# Patient Record
Sex: Female | Born: 1980 | Race: White | Hispanic: No | Marital: Married | Smoking: Former smoker
Health system: Southern US, Community
[De-identification: ages and names within clinical notes are randomized; demographics above are authoritative.]

---

## 2017-03-19 DIAGNOSIS — R87610 Atypical squamous cells of undetermined significance on cytologic smear of cervix (ASC-US): Secondary | ICD-10-CM | POA: Insufficient documentation

## 2017-03-19 DIAGNOSIS — F419 Anxiety disorder, unspecified: Secondary | ICD-10-CM | POA: Insufficient documentation

## 2017-03-19 DIAGNOSIS — I10 Essential (primary) hypertension: Secondary | ICD-10-CM

## 2017-03-19 HISTORY — DX: Anxiety disorder, unspecified: F41.9

## 2017-03-19 HISTORY — DX: Atypical squamous cells of undetermined significance on cytologic smear of cervix (ASC-US): R87.610

## 2017-03-19 HISTORY — DX: Essential (primary) hypertension: I10

## 2017-07-08 DIAGNOSIS — F3340 Major depressive disorder, recurrent, in remission, unspecified: Secondary | ICD-10-CM | POA: Insufficient documentation

## 2017-07-08 HISTORY — DX: Major depressive disorder, recurrent, in remission, unspecified: F33.40

## 2017-10-17 HISTORY — PX: CHOLECYSTECTOMY: SHX55

## 2018-01-29 DIAGNOSIS — M1990 Unspecified osteoarthritis, unspecified site: Secondary | ICD-10-CM

## 2018-01-29 HISTORY — DX: Unspecified osteoarthritis, unspecified site: M19.90

## 2018-02-15 HISTORY — PX: TUBAL LIGATION: SHX77

## 2018-04-16 DIAGNOSIS — R7989 Other specified abnormal findings of blood chemistry: Secondary | ICD-10-CM | POA: Insufficient documentation

## 2018-04-16 HISTORY — DX: Other specified abnormal findings of blood chemistry: R79.89

## 2018-06-30 DIAGNOSIS — E782 Mixed hyperlipidemia: Secondary | ICD-10-CM

## 2018-06-30 HISTORY — DX: Mixed hyperlipidemia: E78.2

## 2018-12-15 DIAGNOSIS — M797 Fibromyalgia: Secondary | ICD-10-CM | POA: Insufficient documentation

## 2018-12-15 HISTORY — DX: Fibromyalgia: M79.7

## 2019-03-25 DIAGNOSIS — Z8249 Family history of ischemic heart disease and other diseases of the circulatory system: Secondary | ICD-10-CM | POA: Insufficient documentation

## 2019-08-19 DIAGNOSIS — M545 Low back pain, unspecified: Secondary | ICD-10-CM | POA: Insufficient documentation

## 2019-08-19 DIAGNOSIS — M5416 Radiculopathy, lumbar region: Secondary | ICD-10-CM | POA: Insufficient documentation

## 2019-08-19 DIAGNOSIS — M5136 Other intervertebral disc degeneration, lumbar region: Secondary | ICD-10-CM | POA: Insufficient documentation

## 2019-08-19 DIAGNOSIS — M419 Scoliosis, unspecified: Secondary | ICD-10-CM | POA: Insufficient documentation

## 2019-08-19 HISTORY — DX: Low back pain, unspecified: M54.50

## 2019-10-10 DIAGNOSIS — S32402A Unspecified fracture of left acetabulum, initial encounter for closed fracture: Secondary | ICD-10-CM | POA: Insufficient documentation

## 2019-10-10 DIAGNOSIS — S3210XA Unspecified fracture of sacrum, initial encounter for closed fracture: Secondary | ICD-10-CM | POA: Insufficient documentation

## 2020-05-25 DIAGNOSIS — S62306A Unspecified fracture of fifth metacarpal bone, right hand, initial encounter for closed fracture: Secondary | ICD-10-CM | POA: Insufficient documentation

## 2020-06-22 DIAGNOSIS — Z4789 Encounter for other orthopedic aftercare: Secondary | ICD-10-CM

## 2020-06-22 HISTORY — DX: Encounter for other orthopedic aftercare: Z47.89

## 2020-07-14 HISTORY — PX: HAND SURGERY: SHX662

## 2021-12-03 ENCOUNTER — Encounter: Payer: Self-pay | Admitting: *Deleted

## 2021-12-03 ENCOUNTER — Encounter: Payer: Self-pay | Admitting: Cardiology

## 2021-12-11 ENCOUNTER — Other Ambulatory Visit: Payer: Self-pay

## 2021-12-11 ENCOUNTER — Encounter: Payer: Self-pay | Admitting: Cardiology

## 2021-12-11 ENCOUNTER — Ambulatory Visit: Payer: Managed Care, Other (non HMO) | Admitting: Cardiology

## 2021-12-11 VITALS — BP 122/78 | HR 108 | Ht 64.0 in | Wt 223.4 lb

## 2021-12-11 DIAGNOSIS — I10 Essential (primary) hypertension: Secondary | ICD-10-CM

## 2021-12-11 DIAGNOSIS — F321 Major depressive disorder, single episode, moderate: Secondary | ICD-10-CM | POA: Insufficient documentation

## 2021-12-11 DIAGNOSIS — R0789 Other chest pain: Secondary | ICD-10-CM

## 2021-12-11 DIAGNOSIS — F411 Generalized anxiety disorder: Secondary | ICD-10-CM

## 2021-12-11 DIAGNOSIS — E039 Hypothyroidism, unspecified: Secondary | ICD-10-CM | POA: Insufficient documentation

## 2021-12-11 DIAGNOSIS — G43709 Chronic migraine without aura, not intractable, without status migrainosus: Secondary | ICD-10-CM | POA: Insufficient documentation

## 2021-12-11 DIAGNOSIS — E669 Obesity, unspecified: Secondary | ICD-10-CM | POA: Insufficient documentation

## 2021-12-11 DIAGNOSIS — Z713 Dietary counseling and surveillance: Secondary | ICD-10-CM

## 2021-12-11 DIAGNOSIS — G4700 Insomnia, unspecified: Secondary | ICD-10-CM

## 2021-12-11 DIAGNOSIS — E559 Vitamin D deficiency, unspecified: Secondary | ICD-10-CM | POA: Insufficient documentation

## 2021-12-11 DIAGNOSIS — D518 Other vitamin B12 deficiency anemias: Secondary | ICD-10-CM

## 2021-12-11 DIAGNOSIS — R079 Chest pain, unspecified: Secondary | ICD-10-CM | POA: Diagnosis not present

## 2021-12-11 DIAGNOSIS — E119 Type 2 diabetes mellitus without complications: Secondary | ICD-10-CM | POA: Insufficient documentation

## 2021-12-11 HISTORY — DX: Insomnia, unspecified: G47.00

## 2021-12-11 HISTORY — DX: Other vitamin B12 deficiency anemias: D51.8

## 2021-12-11 HISTORY — DX: Major depressive disorder, single episode, moderate: F32.1

## 2021-12-11 HISTORY — DX: Dietary counseling and surveillance: Z71.3

## 2021-12-11 HISTORY — DX: Vitamin D deficiency, unspecified: E55.9

## 2021-12-11 HISTORY — DX: Generalized anxiety disorder: F41.1

## 2021-12-11 HISTORY — DX: Other chest pain: R07.89

## 2021-12-11 MED ORDER — METOPROLOL TARTRATE 100 MG PO TABS: 100.0000 mg | ORAL_TABLET | Freq: Once | ORAL | 0 refills | Status: DC

## 2021-12-11 MED ORDER — ASPIRIN EC 81 MG PO TBEC
81.0000 mg | DELAYED_RELEASE_TABLET | Freq: Every day | ORAL | 3 refills | Status: AC
Start: 2021-12-11 — End: ?

## 2021-12-11 NOTE — Progress Notes (Signed)
Cardiology Consultation:    Date:  12/11/2021   ID:  Sydney GuessJessica Fletcher, DOB [birth year], MRN [id number]  PCP:  Joaquin MusicHarless, Emily, NP  Cardiologist:  Gypsy Balsamobert Danisha Brassfield, MD   Referring MD: Joaquin MusicHarless, Emily, NP   Chief Complaint  Patient presents with   eleveated BP and HR    Ongoing for 6 weeks     History of Present Illness:    Sydney GuessJessica Fletcher is a 41 y.o. female who is being seen today for the evaluation of multiple issue including high blood pressure, palpitations, atypical chest pain.  At the request of Joaquin MusicHarless, Emily, NP.  She is a daughter of patient of mine.  She does have constellation of symptoms.  I will she check her blood pressure on the regular basis she see elevation of her blood pressure.  She been taking for medication including diuretic.  She needs criteria for multidrug-resistant hypertension.  On top of that she described heart palpitations.  Abrupt onset of breath also happen anytime not related to exercise.  Also she complain of having chest pain chest pain she described as tightness squeezing pressure burning chest discomfort anytime he wants to.  Does not associate happen with exercise lasting few minutes there is some shortness of breath associated with this sensation but no sweating.  Sometimes he can palpitation with this sensation.  She does not exercise on a regular basis.  She does have fibromyalgia, she said the request was elevated but not much.  She does not smoke, never did.  She snores and wakes up sometimes in middle of night with dry mouth.  Sometimes she wake up in the middle of the night with headache and also with palpitations.  Past Medical History:  Diagnosis Date   Anxiety 03/19/2017   Arthritis 01/29/2018   Formatting of this note might be different from the original. 2019: hands   Atypical squamous cells of undetermined significance on cytologic smear of cervix (ASC-US) 03/19/2017   Formatting of this note might be different from the original. HRHPV (+) Formatting  of this note might be different from the original. HRHPV (+)   Benign hypertension 03/19/2017   Formatting of this note might be different from the original. 2018: stage 3, atenolol Formatting of this note might be different from the original. 2018: stage 3, atenolol   Fibromyalgia 12/15/2018   Low serum progesterone 04/16/2018   Formatting of this note might be different from the original. 2019: post-mirena Formatting of this note might be different from the original. 2019: post-mirena   Mixed hyperlipidemia 06/30/2018   Formatting of this note might be different from the original. 2020   Recurrent major depressive disorder, in remission (HCC) 07/08/2017   Formatting of this note might be different from the original. 2018: 14 Formatting of this note might be different from the original. 2018: 14    Past Surgical History:  Procedure Laterality Date   CHOLECYSTECTOMY  10/2017   HAND SURGERY Right 07/14/2020   TUBAL LIGATION  02/2018    Current Medications: Current Meds  Medication Sig   ALPRAZolam (XANAX) 0.25 MG tablet Take 0.25 mg by mouth daily as needed for anxiety. For panic attacks   amLODipine (NORVASC) 5 MG tablet Take 5 mg by mouth daily.   buPROPion (WELLBUTRIN XL) 150 MG 24 hr tablet Take 150 mg by mouth daily.   cloNIDine (CATAPRES) 0.1 MG tablet Take 0.1 mg by mouth daily as needed (If dystolic over 90). For BP over 160/100   DULoxetine (CYMBALTA) 60  MG capsule Take 60 mg by mouth daily.   Galcanezumab-gnlm (EMGALITY) 120 MG/ML SOAJ Inject 120 mg into the skin every 30 (thirty) days.   hydrochlorothiazide (HYDRODIURIL) 12.5 MG tablet Take 12.5 mg by mouth daily.   Lemborexant (DAYVIGO) 5 MG TABS Take 5 mg by mouth at bedtime.   losartan (COZAAR) 50 MG tablet Take 50 mg by mouth daily.   Ubrogepant (UBRELVY) 100 MG TABS Take 100 mg by mouth as needed (migraine). May take second dose at least 2 hours after first dose as needed once daily   Vitamin D, Ergocalciferol, (DRISDOL) 1.25  MG (50000 UNIT) CAPS capsule Take 50,000 Units by mouth every 7 (seven) days.     Allergies:   Codeine   Social History   Socioeconomic History   Marital status: Married    Spouse name: Not on file   Number of children: Not on file   Years of education: Not on file   Highest education level: Not on file  Occupational History   Not on file  Tobacco Use   Smoking status: Former    Types: Cigarettes    Quit date: 12/19/2020    Years since quitting: 0.9   Smokeless tobacco: Not on file  Substance and Sexual Activity   Alcohol use: Not on file   Drug use: Not on file   Sexual activity: Not on file  Other Topics Concern   Not on file  Social History Narrative   Not on file   Social Determinants of Health   Financial Resource Strain: Not on file  Food Insecurity: Not on file  Transportation Needs: Not on file  Physical Activity: Not on file  Stress: Not on file  Social Connections: Not on file     Family History: The patient's family history includes Diabetes in her paternal grandmother; Heart attack in her father; Hypertension in her father; Migraines in her father; Rheum arthritis in her maternal grandmother. ROS:   Please see the history of present illness.    All 14 point review of systems negative except as described per history of present illness.  EKGs/Labs/Other Studies Reviewed:    The following studies were reviewed today:   EKG:  EKG is  ordered today.  The ekg ordered today demonstrates normal sinus rhythm, normal P interval, with his complex duration of which regards to symptoms  Recent Labs: No results found for requested labs within last 8760 hours.  Recent Lipid Panel No results found for: CHOL, TRIG, HDL, CHOLHDL, VLDL, LDLCALC, LDLDIRECT  Physical Exam:    VS:  BP 122/78 (BP Location: Left Arm, Patient Position: Sitting)    Pulse (!) 108    Ht 5\' 4"  (1.626 m)    Wt 223 lb 6.4 oz (101.3 kg)    SpO2 98%    BMI 38.35 kg/m     Wt Readings from Last  3 Encounters:  12/11/21 223 lb 6.4 oz (101.3 kg)  11/14/21 226 lb (102.5 kg)     GEN:  Well nourished, well developed in no acute distress HEENT: Normal NECK: No JVD; No carotid bruits LYMPHATICS: No lymphadenopathy CARDIAC: RRR, no murmurs, no rubs, no gallops RESPIRATORY:  Clear to auscultation without rales, wheezing or rhonchi  ABDOMEN: Soft, non-tender, non-distended MUSCULOSKELETAL:  No edema; No deformity  SKIN: Warm and dry NEUROLOGIC:  Alert and oriented x 3 PSYCHIATRIC:  Normal affect   ASSESSMENT:    1. Benign hypertension   2. Atypical chest pain   3. Generalized anxiety disorder  PLAN:    In order of problems listed above:  Lady have multiple symptoms for support she is meeting criteria for multidrug-resistant hypertension she is already on diuretic and 3 additional medications.  I will schedule her to have renal duplex evaluation to rule out renal related hypertension.  Secondary issue that need to be done till weight is in possibility of sleep apnea.  We will schedule him to have a sleep study. Because of high blood pressure requiring multiple medications I will ask him to have echocardiogram done to look for evidence of left ventricle hypertrophy. Palpitations I will ask her to wear Zio patch for 2 weeks to see if she got any significant arrhythmia. Chest pain even to light difficult required evaluation.  I started her on baby aspirin every single day, she will have coronary CT angio to rule out significant coronary artery disease.   Medication Adjustments/Labs and Tests Ordered: Current medicines are reviewed at length with the patient today.  Concerns regarding medicines are outlined above.  No orders of the defined types were placed in this encounter.  No orders of the defined types were placed in this encounter.   Signed, Georgeanna Lea, MD, [facility]. 12/11/2021 2:27 PM    [facility]

## 2021-12-11 NOTE — Addendum Note (Signed)
Addended by: Edwyna Shell I on: 12/11/2021 03:02 PM   Modules accepted: Orders

## 2021-12-11 NOTE — Patient Instructions (Addendum)
Medication Instructions:  Your physician has recommended you make the following change in your medication:   START: Aspirin 81 mg daily  *If you need a refill on your cardiac medications before your next appointment, please call your pharmacy*   Lab Work: BMP 1 week before CT If you have labs (blood work) drawn today and your tests are completely normal, you will receive your results only by: MyChart Message (if you have MyChart) OR A paper copy in the mail If you have any lab test that is abnormal or we need to change your treatment, we will call you to review the results.   Testing/Procedures: Your physician has requested that you have an echocardiogram. Echocardiography is a painless test that uses sound waves to create images of your heart. It provides your doctor with information about the size and shape of your heart and how well your hearts chambers and valves are working. This procedure takes approximately one hour. There are no restrictions for this procedure.  A zio monitor was ordered today. It will remain on for 14 days. You will then return monitor and event diary in provided box. It takes 1-2 weeks for report to be downloaded and returned to [place]. We will call you with the results. If monitor falls off or has orange flashing light, please call Zio for further instructions.      Your cardiac CT will be scheduled at one of the below locations:   [facility] [address] [place], [place] [address] [phone]  OR  [facility] [address] [place], [place] [address] [phone]  If scheduled at [facility], please arrive at the [facility] main entrance (entrance A) of [facility] 30 minutes prior to test start time. You can use the FREE valet parking offered at the main entrance (encouraged to control the heart rate for the test) Proceed to the [facility] Radiology Department (first  floor) to check-in and test prep.  If scheduled at [facility], please arrive 15 mins early for check-in and test prep.  Please follow these instructions carefully (unless otherwise directed):   On the Night Before the Test: Be sure to Drink plenty of water. Do not consume any caffeinated/decaffeinated beverages or chocolate 12 hours prior to your test. Do not take any antihistamines 12 hours prior to your test.  On the Day of the Test: Drink plenty of water until 1 hour prior to the test. Do not eat any food 4 hours prior to the test. You may take your regular medications prior to the test.  Take metoprolol (Lopressor) two hours prior to test. HOLD Hydrochlorothiazide morning of the test. FEMALES- please wear underwire-free bra if available, avoid dresses & tight clothing        After the Test: Drink plenty of water. After receiving IV contrast, you may experience a mild flushed feeling. This is normal. On occasion, you may experience a mild rash up to 24 hours after the test. This is not dangerous. If this occurs, you can take Benadryl 25 mg and increase your fluid intake. If you experience trouble breathing, this can be serious. If it is severe call 911 IMMEDIATELY. If it is mild, please call our office.   We will call to schedule your test 2-4 weeks out understanding that some insurance companies will need an authorization prior to the service being performed.   For non-scheduling related questions, please contact the cardiac imaging  nurse navigator should you have any questions/concerns: Rockwell Alexandria, Cardiac Imaging Nurse Navigator Larey Brick, Cardiac Imaging Nurse Navigator [facility] Direct Office Dial: [phone]   For scheduling needs, including cancellations and rescheduling, please call [place], [phone].    Follow-Up: At [facility], you and your health needs are our priority.  As part of our  continuing mission to provide you with exceptional heart care, we have created designated Provider Care Teams.  These Care Teams include your primary Cardiologist (physician) and Advanced Practice Providers (APPs -  Physician Assistants and Nurse Practitioners) who all work together to provide you with the care you need, when you need it.  We recommend signing up for the patient portal called "MyChart".  Sign up information is provided on this After Visit Summary.  MyChart is used to connect with patients for Virtual Visits (Telemedicine).  Patients are able to view lab/test results, encounter notes, upcoming appointments, etc.  Non-urgent messages can be sent to your provider as well.   To learn more about what you can do with MyChart, go to [email or web address].    Your next appointment:   2 month(s)  The format for your next appointment:   In Person  Provider:   Gypsy Balsam, MD    Other Instructions Ambulatory sleep study referral ordered

## 2021-12-12 ENCOUNTER — Ambulatory Visit (INDEPENDENT_AMBULATORY_CARE_PROVIDER_SITE_OTHER): Payer: Managed Care, Other (non HMO)

## 2021-12-12 DIAGNOSIS — R0789 Other chest pain: Secondary | ICD-10-CM | POA: Diagnosis not present

## 2021-12-12 DIAGNOSIS — R002 Palpitations: Secondary | ICD-10-CM | POA: Diagnosis not present

## 2021-12-12 DIAGNOSIS — R079 Chest pain, unspecified: Secondary | ICD-10-CM

## 2021-12-12 DIAGNOSIS — I10 Essential (primary) hypertension: Secondary | ICD-10-CM

## 2021-12-12 DIAGNOSIS — F411 Generalized anxiety disorder: Secondary | ICD-10-CM | POA: Diagnosis not present

## 2021-12-17 ENCOUNTER — Other Ambulatory Visit: Payer: Self-pay

## 2021-12-17 ENCOUNTER — Ambulatory Visit (INDEPENDENT_AMBULATORY_CARE_PROVIDER_SITE_OTHER): Payer: Managed Care, Other (non HMO)

## 2021-12-17 DIAGNOSIS — R0789 Other chest pain: Secondary | ICD-10-CM | POA: Diagnosis not present

## 2021-12-17 DIAGNOSIS — I10 Essential (primary) hypertension: Secondary | ICD-10-CM | POA: Diagnosis not present

## 2021-12-17 DIAGNOSIS — F411 Generalized anxiety disorder: Secondary | ICD-10-CM

## 2021-12-17 DIAGNOSIS — R079 Chest pain, unspecified: Secondary | ICD-10-CM

## 2021-12-17 LAB — ECHOCARDIOGRAM COMPLETE
Area-P 1/2: 4.96 cm2
S' Lateral: 2.8 cm

## 2021-12-18 LAB — BASIC METABOLIC PANEL
BUN/Creatinine Ratio: 8 — ABNORMAL LOW (ref 9–23)
BUN: 7 mg/dL (ref 6–24)
CO2: 24 mmol/L (ref 20–29)
Calcium: 9.6 mg/dL (ref 8.7–10.2)
Chloride: 101 mmol/L (ref 96–106)
Creatinine, Ser: 0.9 mg/dL (ref 0.57–1.00)
Glucose: 84 mg/dL (ref 70–99)
Potassium: 3.7 mmol/L (ref 3.5–5.2)
Sodium: 142 mmol/L (ref 134–144)
eGFR: 83 mL/min/{1.73_m2} (ref >59)

## 2021-12-20 ENCOUNTER — Telehealth (HOSPITAL_COMMUNITY): Payer: Self-pay | Admitting: *Deleted

## 2021-12-20 NOTE — Telephone Encounter (Signed)
Reaching out to patient to offer assistance regarding upcoming cardiac imaging study; pt verbalizes understanding of appt date/time, parking situation and where to check in, pre-test NPO status and medications ordered, and verified current allergies; name and call back number provided for further questions should they arise ° °Aryaa Bunting RN Navigator Cardiac Imaging °[facility] °[phone] office °[phone] cell ° °Patient to take 100mg metoprolol tartrate two hours prior to her cardiac CT scan. She is aware to arrive at 2:45pm for her 3:15pm scan. °

## 2021-12-23 ENCOUNTER — Other Ambulatory Visit: Payer: Self-pay

## 2021-12-23 ENCOUNTER — Encounter (HOSPITAL_COMMUNITY): Payer: Self-pay

## 2021-12-23 ENCOUNTER — Ambulatory Visit (HOSPITAL_COMMUNITY)
Admission: RE | Admit: 2021-12-23 | Discharge: 2021-12-23 | Disposition: A | Discharge status: Home / Self Care | Payer: Managed Care, Other (non HMO) | Source: Ambulatory Visit | Attending: Cardiology | Admitting: Cardiology

## 2021-12-23 DIAGNOSIS — R079 Chest pain, unspecified: Secondary | ICD-10-CM | POA: Insufficient documentation

## 2021-12-23 MED ORDER — NITROGLYCERIN 0.4 MG SL SUBL
0.8000 mg | SUBLINGUAL_TABLET | Freq: Once | SUBLINGUAL | Status: AC
  Administered 2021-12-23: 0.8 mg via SUBLINGUAL

## 2021-12-23 MED ORDER — IOHEXOL 350 MG/ML SOLN
95.0000 mL | Freq: Once | INTRAVENOUS | Status: AC | PRN
  Administered 2021-12-23: 95 mL via INTRAVENOUS

## 2021-12-23 MED ORDER — NITROGLYCERIN 0.4 MG SL SUBL
SUBLINGUAL_TABLET | SUBLINGUAL | Status: AC
  Filled 2021-12-23: qty 2

## 2022-01-02 ENCOUNTER — Ambulatory Visit: Payer: Managed Care, Other (non HMO) | Admitting: Cardiology

## 2022-01-14 ENCOUNTER — Encounter: Payer: Self-pay | Admitting: Cardiology

## 2022-01-14 ENCOUNTER — Other Ambulatory Visit: Payer: Self-pay

## 2022-01-14 MED ORDER — METOPROLOL SUCCINATE ER 100 MG PO TB24: 100.0000 mg | ORAL_TABLET | Freq: Every day | ORAL | 3 refills | Status: AC

## 2022-01-22 ENCOUNTER — Telehealth: Payer: Self-pay | Admitting: Cardiology

## 2022-01-22 DIAGNOSIS — G4700 Insomnia, unspecified: Secondary | ICD-10-CM

## 2022-01-22 DIAGNOSIS — E669 Obesity, unspecified: Secondary | ICD-10-CM

## 2022-01-22 NOTE — Telephone Encounter (Signed)
Order placed for sleep study. Patient informed and has no further questions at this time. ?

## 2022-01-22 NOTE — Telephone Encounter (Signed)
Patient needs an order for a sleep study, not a referral. Per [facility] they do not do anything with referrals. ? ?Our office will need to put in an order for the patient to have it done. Please call patient once test is ready to schedule  ?

## 2022-01-31 ENCOUNTER — Telehealth: Payer: Self-pay | Admitting: *Deleted

## 2022-01-31 ENCOUNTER — Other Ambulatory Visit: Payer: Self-pay | Admitting: Cardiology

## 2022-01-31 DIAGNOSIS — G4709 Other insomnia: Secondary | ICD-10-CM

## 2022-01-31 DIAGNOSIS — R0683 Snoring: Secondary | ICD-10-CM

## 2022-01-31 DIAGNOSIS — I10 Essential (primary) hypertension: Secondary | ICD-10-CM

## 2022-01-31 NOTE — Telephone Encounter (Signed)
Patient notified her insurance denied an in lab study due to no co morbidities. Recommended HST. HST scheduled for 03/11/22 @ 1:30 pm. Appointment details were given to the patient. ?

## 2022-02-04 ENCOUNTER — Telehealth: Payer: Self-pay | Admitting: Cardiology

## 2022-02-04 NOTE — Telephone Encounter (Signed)
Pt c/o BP issue: STAT if pt c/o blurred vision, one-sided weakness or slurred speech ? ?1. What are your last 5 BP readings? 110/61, 93/69, 92/60 ? ?2. Are you having any other symptoms (ex. Dizziness, headache, blurred vision, passed out)? extremely weak,  can hardly do anything,lightheaded ? ?3. What is your BP issue? Low blood pressure ? ?

## 2022-02-06 NOTE — Telephone Encounter (Signed)
Called patient and informed her of Dr. Vanetta Shawl recommendation to stop taking her hydrochlorothiazide. Patient was agreeable with this plan of care and had no further questions at this time. ?

## 2022-02-12 ENCOUNTER — Encounter: Payer: Self-pay | Admitting: Cardiology

## 2022-02-12 ENCOUNTER — Ambulatory Visit: Payer: Managed Care, Other (non HMO) | Admitting: Cardiology

## 2022-02-12 VITALS — BP 100/70 | HR 70 | Ht 64.0 in | Wt 229.2 lb

## 2022-02-12 DIAGNOSIS — G4709 Other insomnia: Secondary | ICD-10-CM

## 2022-02-12 DIAGNOSIS — Z8249 Family history of ischemic heart disease and other diseases of the circulatory system: Secondary | ICD-10-CM | POA: Diagnosis not present

## 2022-02-12 DIAGNOSIS — R0789 Other chest pain: Secondary | ICD-10-CM | POA: Diagnosis not present

## 2022-02-12 DIAGNOSIS — F419 Anxiety disorder, unspecified: Secondary | ICD-10-CM

## 2022-02-12 DIAGNOSIS — I10 Essential (primary) hypertension: Secondary | ICD-10-CM | POA: Diagnosis not present

## 2022-02-12 NOTE — Progress Notes (Signed)
?Cardiology Office Note:   ? ?Date:  02/12/2022  ? ?ID:  Sydney Fletcher, DOB 08-Mar-1981, MRN [id number] ? ?PCP:  Joaquin Music, NP  ?Cardiologist:  Gypsy Balsam, MD   ? ?Referring MD: Joaquin Music, NP  ? ?Chief Complaint  ?Patient presents with  ? Low BP  ?  Ongoing for a week   ? ? ?History of Present Illness:   ? ?Sydney Fletcher is a 41 y.o. female with past medical history significant for essential hypertension, depression, anxiety, she was referred to [place] because of constellation of symptoms and quite extensive evaluation has been performed for so far she did have coronary CT angio that was done because of atypical chest pain that came showing normal coronaries without significant obstruction, she was complaining of having some palpitations Zio patch has been placed and she was identified to have some supraventricular tachycardia, her metoprolol was changed from tartrate to succinate that seems to be controlling palpitations quite nicely, she also had echocardiogram done which showed preserved left ventricle ejection fraction, she does have left ventricle hypertrophy however.  She is awaiting sleep study which I think would be extremely beneficial to her.  Previously she had high blood pressure and there was difficulty managing this but now interestingly her blood pressure seems to be low she discontinue her hydrochlorothiazide in spite of that her blood pressure is still low I will ask her to discontinue her amlodipine.  That make it very happy because she is been complaining findings swelling of lower extremities.  Overall she is excited about sleep study hopefully she said somebody will be able to find out what the problem with her sleeping pattern is ? ?Past Medical History:  ?Diagnosis Date  ? Anxiety 03/19/2017  ? Arthritis 01/29/2018  ? Formatting of this note might be different from the original. 2019: hands  ? Atypical squamous cells of undetermined significance on cytologic smear of cervix (ASC-US)  03/19/2017  ? Formatting of this note might be different from the original. HRHPV (+) Formatting of this note might be different from the original. HRHPV (+)  ? Benign hypertension 03/19/2017  ? Formatting of this note might be different from the original. 2018: stage 3, atenolol Formatting of this note might be different from the original. 2018: stage 3, atenolol  ? Fibromyalgia 12/15/2018  ? Low serum progesterone 04/16/2018  ? Formatting of this note might be different from the original. 2019: post-mirena Formatting of this note might be different from the original. 2019: post-mirena  ? Mixed hyperlipidemia 06/30/2018  ? Formatting of this note might be different from the original. 2020  ? Recurrent major depressive disorder, in remission (HCC) 07/08/2017  ? Formatting of this note might be different from the original. 2018: 14 Formatting of this note might be different from the original. 2018: 14  ? ? ?Past Surgical History:  ?Procedure Laterality Date  ? CHOLECYSTECTOMY  10/2017  ? HAND SURGERY Right 07/14/2020  ? TUBAL LIGATION  02/2018  ? ? ?Current Medications: ?Current Meds  ?Medication Sig  ? ALPRAZolam (XANAX) 0.25 MG tablet Take 0.25 mg by mouth daily as needed for anxiety. For panic attacks  ? amLODipine (NORVASC) 5 MG tablet Take 5 mg by mouth daily.  ? aspirin EC 81 MG tablet Take 1 tablet (81 mg total) by mouth daily. Swallow whole.  ? buPROPion (WELLBUTRIN XL) 150 MG 24 hr tablet Take 150 mg by mouth daily.  ? cloNIDine (CATAPRES) 0.1 MG tablet Take 0.1 mg by mouth daily as  needed (If dystolic over 90). For BP over 160/100  ? DULoxetine (CYMBALTA) 60 MG capsule Take 60 mg by mouth daily.  ? Galcanezumab-gnlm (EMGALITY) 120 MG/ML SOAJ Inject 120 mg into the skin every 30 (thirty) days.  ? Lemborexant (DAYVIGO) 5 MG TABS Take 5 mg by mouth at bedtime.  ? losartan (COZAAR) 50 MG tablet Take 50 mg by mouth daily.  ? metoprolol succinate (TOPROL-XL) 100 MG 24 hr tablet Take 1 tablet (100 mg total) by mouth  daily. Take with or immediately following a meal.  ? Ubrogepant (UBRELVY) 100 MG TABS Take 100 mg by mouth as needed (migraine). May take second dose at least 2 hours after first dose as needed once daily  ? Vitamin D, Ergocalciferol, (DRISDOL) 1.25 MG (50000 UNIT) CAPS capsule Take 50,000 Units by mouth every 7 (seven) days.  ?  ? ?Allergies:   Codeine  ? ?Social History  ? ?Socioeconomic History  ? Marital status: Married  ?  Spouse name: Not on file  ? Number of children: Not on file  ? Years of education: Not on file  ? Highest education level: Not on file  ?Occupational History  ? Not on file  ?Tobacco Use  ? Smoking status: Former  ?  Types: Cigarettes  ?  Quit date: 12/19/2020  ?  Years since quitting: 1.1  ? Smokeless tobacco: Never  ?Substance and Sexual Activity  ? Alcohol use: Not Currently  ? Drug use: Never  ? Sexual activity: Yes  ?Other Topics Concern  ? Not on file  ?Social History Narrative  ? Not on file  ? ?Social Determinants of Health  ? ?Financial Resource Strain: Not on file  ?Food Insecurity: Not on file  ?Transportation Needs: Not on file  ?Physical Activity: Not on file  ?Stress: Not on file  ?Social Connections: Not on file  ?  ? ?Family History: ?The patient's family history includes Diabetes in her paternal grandmother; Heart attack in her father; Hypertension in her father; Migraines in her father; Rheum arthritis in her maternal grandmother. ?ROS:   ?Please see the history of present illness.    ?All 14 point review of systems negative except as described per history of present illness ? ?EKGs/Labs/Other Studies Reviewed:   ? ? ? ?Recent Labs: ?12/17/2021: BUN 7; Creatinine, Ser 0.90; Potassium 3.7; Sodium 142  ?Recent Lipid Panel ?No results found for: CHOL, TRIG, HDL, CHOLHDL, VLDL, LDLCALC, LDLDIRECT ? ?Physical Exam:   ? ?VS:  BP 100/70 (BP Location: Left Arm, Patient Position: Sitting)   Pulse 70   Ht 5\' 4"  (1.626 m)   Wt 229 lb 3.2 oz (104 kg)   SpO2 98%   BMI 39.34 kg/m?     ? ?Wt Readings from Last 3 Encounters:  ?02/12/22 229 lb 3.2 oz (104 kg)  ?12/11/21 223 lb 6.4 oz (101.3 kg)  ?11/14/21 226 lb (102.5 kg)  ?  ? ?GEN:  Well nourished, well developed in no acute distress ?HEENT: Normal ?NECK: No JVD; No carotid bruits ?LYMPHATICS: No lymphadenopathy ?CARDIAC: RRR, no murmurs, no rubs, no gallops ?RESPIRATORY:  Clear to auscultation without rales, wheezing or rhonchi  ?ABDOMEN: Soft, non-tender, non-distended ?MUSCULOSKELETAL:  No edema; No deformity  ?SKIN: Warm and dry ?LOWER EXTREMITIES: no swelling ?NEUROLOGIC:  Alert and oriented x 3 ?PSYCHIATRIC:  Normal affect  ? ?ASSESSMENT:   ? ?1. Benign hypertension   ?2. Atypical chest pain   ?3. Other insomnia   ?4. Family history of premature coronary artery disease   ?  5. Anxiety   ? ?PLAN:   ? ?In order of problems listed above: ? ?Atypical chest pain coronary CT angio showed normal coronaries. ?Benign essential hypertension, blood pressure seems to be low right now we will discontinue amlodipine prescription sent results of her blood pressure within next few weeks. ?Insomnia sleep study scheduled she is awaiting to have it done ?Family history of premature coronary artery disease the key is risk factors modifications.  I will offer her to have fasting lipid profile done since I do not see any recent results ? ? ?Medication Adjustments/Labs and Tests Ordered: ?Current medicines are reviewed at length with the patient today.  Concerns regarding medicines are outlined above.  ?No orders of the defined types were placed in this encounter. ? ?Medication changes: No orders of the defined types were placed in this encounter. ? ? ?Signed, ?Georgeanna Lea, MD, [facility] ?02/12/2022 4:06 PM    ?[facility] ?

## 2022-02-12 NOTE — Patient Instructions (Signed)
Medication Instructions:  °Your physician recommends that you continue on your current medications as directed. Please refer to the Current Medication list given to you today. ° °*If you need a refill on your cardiac medications before your next appointment, please call your pharmacy* ° ° °Lab Work: °Your physician recommends that you return for lab work in: Labs: Lipids °If you have labs (blood work) drawn today and your tests are completely normal, you will receive your results only by: °MyChart Message (if you have MyChart) OR °A paper copy in the mail °If you have any lab test that is abnormal or we need to change your treatment, we will call you to review the results. ° ° °Testing/Procedures: °None ° ° °Follow-Up: °At [facility], you and your health needs are our priority.  As part of our continuing mission to provide you with exceptional heart care, we have created designated Provider Care Teams.  These Care Teams include your primary Cardiologist (physician) and Advanced Practice Providers (APPs -  Physician Assistants and Nurse Practitioners) who all work together to provide you with the care you need, when you need it. ° °We recommend signing up for the patient portal called "MyChart".  Sign up information is provided on this After Visit Summary.  MyChart is used to connect with patients for Virtual Visits (Telemedicine).  Patients are able to view lab/test results, encounter notes, upcoming appointments, etc.  Non-urgent messages can be sent to your provider as well.   °To learn more about what you can do with MyChart, go to [email or web address].   ° °Your next appointment:   °6 month(s) ° °The format for your next appointment:   °In Person ° °Provider:   °Robert Krasowski, MD  ° ° °Other Instructions °None ° °

## 2022-03-11 ENCOUNTER — Ambulatory Visit (HOSPITAL_BASED_OUTPATIENT_CLINIC_OR_DEPARTMENT_OTHER): Payer: Managed Care, Other (non HMO) | Attending: Cardiology | Admitting: Cardiovascular Disease

## 2022-03-11 VITALS — Ht 64.0 in | Wt 228.0 lb

## 2022-03-11 DIAGNOSIS — G4719 Other hypersomnia: Secondary | ICD-10-CM

## 2022-03-11 DIAGNOSIS — I10 Essential (primary) hypertension: Secondary | ICD-10-CM | POA: Diagnosis not present

## 2022-03-11 DIAGNOSIS — G4709 Other insomnia: Secondary | ICD-10-CM | POA: Insufficient documentation

## 2022-03-11 DIAGNOSIS — G4733 Obstructive sleep apnea (adult) (pediatric): Secondary | ICD-10-CM | POA: Diagnosis not present

## 2022-03-11 DIAGNOSIS — R0683 Snoring: Secondary | ICD-10-CM | POA: Insufficient documentation

## 2022-03-18 ENCOUNTER — Encounter (HOSPITAL_BASED_OUTPATIENT_CLINIC_OR_DEPARTMENT_OTHER): Payer: Self-pay | Admitting: Cardiovascular Disease

## 2022-03-18 NOTE — Procedures (Signed)
? ? ? ?  Patient Name: Sydney Fletcher, Sydney Fletcher ?Study Date: 03/11/2022 ?Gender: Female ?D.O.B: 1980/12/11 ?Age (years): 40 ?Referring Provider: Georgeanna Lea ?Height (inches): 64 ?Interpreting Physician: Nicki Guadalajara MD, ABSM ?Weight (lbs): 228 ?RPSGT: Virden Sink ?BMI: 39 ?MRN: [id number] ?Neck Size: 14.00 ? ?CLINICAL INFORMATION ?Sleep Study Type: HST ? ?Indication for sleep study: Snoring, excessive daytime sleepiness ? ?Epworth Sleepiness Score: 17 ? ?SLEEP STUDY TECHNIQUE ?A multi-channel overnight portable sleep study was performed. The channels recorded were: nasal airflow, thoracic respiratory movement, and oxygen saturation with a pulse oximetry. Snoring was also monitored. ? ?MEDICATIONS ?ALPRAZolam (XANAX) 0.25 MG tablet ?amLODipine (NORVASC) 5 MG tablet ?aspirin EC 81 MG tablet ?buPROPion (WELLBUTRIN XL) 150 MG 24 hr tablet ?cloNIDine (CATAPRES) 0.1 MG tablet ?DULoxetine (CYMBALTA) 60 MG capsule ?Galcanezumab-gnlm (EMGALITY) 120 MG/ML SOAJ ?Lemborexant (DAYVIGO) 5 MG TABS ?losartan (COZAAR) 50 MG tablet ?metoprolol succinate (TOPROL-XL) 100 MG 24 hr tablet ?Ubrogepant (UBRELVY) 100 MG TABS ?Vitamin D, Ergocalciferol, (DRISDOL) 1.25 MG (50000 UNIT) CAPS capsule ?Patient self administered medications include: N/A. ? ?SLEEP ARCHITECTURE ?Patient was studied for 381.1 minutes. The sleep efficiency was 100.0 % and the patient was supine for 47.1%. The arousal index was 0.0 per hour. ? ?RESPIRATORY PARAMETERS ?The overall AHI was 6.1 per hour, with a central apnea index of 0 per hour. ? ?The oxygen nadir was 91% during sleep. ? ?CARDIAC DATA ?Mean heart rate during sleep was 69.3 bpm. Heart rate range was 56 - 92 bpm. ? ?IMPRESSIONS ?- Mild obstructive sleep apnea occurred during this study (AHI 6.1/h); events wore worse with supine sleep (AHI 10.4/h) versus non-supine sleep (AHI 2.6/h). The severity during REM sleep cannot be assessed on this study. ?- The patient did not have significant oxygen  desaturation with O2 nadir 91%. ?- Patient snored for 103.2 minutes (27.1%)  during the sleep. ? ?DIAGNOSIS ?- Obstructive Sleep Apnea (G47.33) ? ?RECOMMENDATIONS ?- Therapeutic CPAP titration to determine optimal pressure required to alleviate sleep disordered breathing. Consider a trial of Auto-PAP with EPR of 3 at 6 - 16 cm of water.  ?- Effort should be made to optimize nasal and oropharyngeal patency. ?- Positional therapy avoiding supine position during sleep. ?- If patient is against CPAP initiation a customized oral appliance may be considered. ?- If patient continues to have excessive daytime sleepiness on therapy consider a further evaluation to assess for narcolepsy or idiopathic hypersomnia. ?- Avoid alcohol, sedatives and other CNS depressants that may worsen sleep apnea and disrupt normal sleep architecture. ?- Sleep hygiene should be reviewed to assess factors that may improve sleep quality. ?- Weight management (BMI 39) and regular exercise should be initiated or continued. ?- Recommend a download and sleep clinic evaluation one month after initiation of therapy ? ? ?[Electronically signed] 03/18/2022 07:31 PM ? ?Nicki Guadalajara MD, [facility], ABSM ?Diplomate, Biomedical engineer of Sleep Medicine ? ?NPI: [id number] ? ?[facility] ?PH: [phone]   FX: [phone] ?ACCREDITED BY THE AMERICAN ACADEMY OF SLEEP MEDICINE ? ?

## 2022-03-19 ENCOUNTER — Telehealth: Payer: Self-pay | Admitting: *Deleted

## 2022-03-19 NOTE — Telephone Encounter (Signed)
-----   Message from Lennette Bihari, MD sent at 03/18/2022  7:37 PM EDT ----- ?Sydney Fletcher, please notify pt and set up for Auto-PAP; if patient against can consider oral appliance. ?

## 2022-03-19 NOTE — Telephone Encounter (Signed)
Patient notified of HST results and recommendations. She would like to do CPAP therapy. Patient lives in [place]. CPAP order will be sent to [facility] in [place]. ?

## 2022-05-19 DIAGNOSIS — R569 Unspecified convulsions: Secondary | ICD-10-CM | POA: Insufficient documentation

## 2022-05-19 HISTORY — DX: Unspecified convulsions: R56.9

## 2022-06-02 DIAGNOSIS — G379 Demyelinating disease of central nervous system, unspecified: Secondary | ICD-10-CM | POA: Insufficient documentation

## 2022-07-09 ENCOUNTER — Ambulatory Visit (INDEPENDENT_AMBULATORY_CARE_PROVIDER_SITE_OTHER): Payer: BC Managed Care – PPO | Admitting: Cardiovascular Disease

## 2022-07-09 ENCOUNTER — Encounter: Payer: Self-pay | Admitting: Cardiovascular Disease

## 2022-07-09 VITALS — BP 122/74 | HR 67 | Ht 64.0 in | Wt 244.0 lb

## 2022-07-09 DIAGNOSIS — G4733 Obstructive sleep apnea (adult) (pediatric): Secondary | ICD-10-CM | POA: Diagnosis not present

## 2022-07-09 DIAGNOSIS — I10 Essential (primary) hypertension: Secondary | ICD-10-CM

## 2022-07-09 DIAGNOSIS — R0789 Other chest pain: Secondary | ICD-10-CM

## 2022-07-09 DIAGNOSIS — Z9989 Dependence on other enabling machines and devices: Secondary | ICD-10-CM | POA: Diagnosis not present

## 2022-07-09 DIAGNOSIS — I471 Supraventricular tachycardia: Secondary | ICD-10-CM

## 2022-07-09 DIAGNOSIS — G4719 Other hypersomnia: Secondary | ICD-10-CM

## 2022-07-09 DIAGNOSIS — R0683 Snoring: Secondary | ICD-10-CM

## 2022-07-09 NOTE — Progress Notes (Signed)
Cardiology Office Note    Date:  07/12/2022   ID:  Sydney Fletcher, DOB 03/15/1981, MRN [id number]  PCP:  Joaquin Music, NP  Cardiologist:  Nicki Guadalajara, MD (sleep); Dr. Bing Matter  New sleep consultation referred by Dr. Bing Matter   History of Present Illness:  Sydney Fletcher is a 42 y.o. female who is followed by Dr. Bing Matter for cardiology care.  She has a history of essential hypertension, depression, anxiety, and atypical chest pain.  She was found to have normal coronary arteries on coronary CT angiography.  She has had issues with palpitations and was found to have several episodes of SVT on Zio patch monitoring for which she is now on metoprolol succinate.  She has a history of hypertension with some blood pressure lability.  She has had issues with shaking episodes.  She is being evaluated by a neurologist in [facility] and apparently had an MRI and is to have a spinal tap to evaluate for potential MS.  She has been documented to have preserved global LV contractility with left ventricular hypertrophy.  Due to concerns for obstructive sleep apnea she was referred by Dr. Bing Matter for a home sleep study which was done on March 11, 2022.  At that time she admitted to snoring, nonrestorative sleep, nocturia, and significant daytime sleepiness with an Epworth Sleepiness Scale score calculating at 17.  On her home study, she was found to have overall mild sleep apnea with an AHI of 6.1 with events worse with supine sleep with an AHI of 10.4.  She snored for 103.2 minutes of her sleep evaluation.  It was recommended that she consider a trial of AutoPap therapy with EPR of 3 at a 6 to 16 cm pressure range and also potential alternatives such as a customized oral appliance could be considered.  Her CPAP set up date was April 22, 2022.  Initially, she was sleeping in a recliner.  She is compliant on her most recent download from July 24 through August 22 with usage days at 97% and usage greater than 4  hours at 83%.  She recently has been averaging 5 hours and 8 minutes of CPAP use.  She typically goes to bed between 11 PM and midnight and wakes up at 7 AM.  At her pressure range of 6 to 16 cm of water, AHI is 1.4/h.  Her 95th percentile pressure is 12.0 with maximum average pressure of 13.1.  Since initiating CPAP therapy, she feels significantly better.  She is less sleepy.  Her sleep is restorative.  Previous significant daytime sleepiness has improved.  She presents for her initial sleep consultation and evaluation.   Past Medical History:  Diagnosis Date   Anxiety 03/19/2017   Arthritis 01/29/2018   Formatting of this note might be different from the original. 2019: hands   Atypical squamous cells of undetermined significance on cytologic smear of cervix (ASC-US) 03/19/2017   Formatting of this note might be different from the original. HRHPV (+) Formatting of this note might be different from the original. HRHPV (+)   Benign hypertension 03/19/2017   Formatting of this note might be different from the original. 2018: stage 3, atenolol Formatting of this note might be different from the original. 2018: stage 3, atenolol   Fibromyalgia 12/15/2018   Low serum progesterone 04/16/2018   Formatting of this note might be different from the original. 2019: post-mirena Formatting of this note might be different from the original. 2019: post-mirena   Mixed hyperlipidemia 06/30/2018  Formatting of this note might be different from the original. 2020   Recurrent major depressive disorder, in remission (HCC) 07/08/2017   Formatting of this note might be different from the original. 2018: 14 Formatting of this note might be different from the original. 2018: 14    Past Surgical History:  Procedure Laterality Date   CHOLECYSTECTOMY  10/2017   HAND SURGERY Right 07/14/2020   TUBAL LIGATION  02/2018    Current Medications: Outpatient Medications Prior to Visit  Medication Sig Dispense Refill   ALPRAZolam  (XANAX) 0.25 MG tablet Take 0.25 mg by mouth daily as needed for anxiety. For panic attacks     amLODipine (NORVASC) 5 MG tablet Take 5 mg by mouth daily.     aspirin EC 81 MG tablet Take 1 tablet (81 mg total) by mouth daily. Swallow whole. 90 tablet 3   buPROPion (WELLBUTRIN XL) 150 MG 24 hr tablet Take 150 mg by mouth daily.     cloNIDine (CATAPRES) 0.1 MG tablet Take 0.1 mg by mouth daily as needed (If dystolic over 90). For BP over 160/100     DULoxetine (CYMBALTA) 60 MG capsule Take 60 mg by mouth daily.     losartan (COZAAR) 50 MG tablet Take 50 mg by mouth daily.     metoprolol succinate (TOPROL-XL) 100 MG 24 hr tablet Take 1 tablet (100 mg total) by mouth daily. Take with or immediately following a meal. 90 tablet 3   Ubrogepant (UBRELVY) 100 MG TABS Take 100 mg by mouth as needed (migraine). May take second dose at least 2 hours after first dose as needed once daily     Vitamin D, Ergocalciferol, (DRISDOL) 1.25 MG (50000 UNIT) CAPS capsule Take 50,000 Units by mouth every 7 (seven) days.     Galcanezumab-gnlm (EMGALITY) 120 MG/ML SOAJ Inject 120 mg into the skin every 30 (thirty) days.     Lemborexant (DAYVIGO) 5 MG TABS Take 5 mg by mouth at bedtime.     No facility-administered medications prior to visit.     Allergies:   Codeine   Social History   Socioeconomic History   Marital status: Married    Spouse name: Not on file   Number of children: Not on file   Years of education: Not on file   Highest education level: Not on file  Occupational History   Not on file  Tobacco Use   Smoking status: Former    Types: Cigarettes    Quit date: 12/19/2020    Years since quitting: 1.5   Smokeless tobacco: Never  Substance and Sexual Activity   Alcohol use: Not Currently   Drug use: Never   Sexual activity: Yes  Other Topics Concern   Not on file  Social History Narrative   Not on file   Social Determinants of Health   Financial Resource Strain: Not on file  Food  Insecurity: Not on file  Transportation Needs: Not on file  Physical Activity: Not on file  Stress: Not on file  Social Connections: Not on file    Socially she was born in [place].  She is in her second marriage.  She has 2 children.  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  She does not significantly exercise.  Family History:  The patient's family history includes Diabetes in her paternal grandmother; Heart attack in her father; Hypertension in her father; Migraines in her father; Rheum arthritis in her maternal grandmother.   Her father is 83 and has heart  problems.  He also has sleep apnea for at least 5 years documented.  Her mother is 51.  She has 1 brother age 60.  ROS General: Negative; No fevers, chills, or night sweats;  HEENT: Negative; No changes in vision or hearing, sinus congestion, difficulty swallowing Pulmonary: Negative; No cough, wheezing, shortness of breath, hemoptysis Cardiovascular: Positive for palpitations with SVT noted on ZIO monitoring, hypertension, GI: Negative; No nausea, vomiting, diarrhea, or abdominal pain GU: Negative; No dysuria, hematuria, or difficulty voiding Musculoskeletal: Negative; no myalgias, joint pain, or weakness Hematologic/Oncology: Negative; no easy bruising, bleeding Endocrine: Negative; no heat/cold intolerance; no diabetes Neuro: Negative; no changes in balance, headaches Skin: Negative; No rashes or skin lesions Psychiatric: Anxiety/depression Sleep: See HPI Other comprehensive 14 point system review is negative.   PHYSICAL EXAM:   VS:  BP 122/74   Pulse 67   Ht 5\' 4"  (1.626 m)   Wt 244 lb (110.7 kg)   SpO2 98%   BMI 41.88 kg/m     Repeat blood pressure by me was 118/70  Wt Readings from Last 3 Encounters:  07/09/22 244 lb (110.7 kg)  03/11/22 228 lb (103.4 kg)  02/12/22 229 lb 3.2 oz (104 kg)    General: Alert, oriented, no distress.  Morbid obesity Skin: normal turgor, no rashes, warm and dry HEENT:  Normocephalic, atraumatic. Pupils equal round and reactive to light; sclera anicteric; extraocular muscles intact;  Nose without nasal septal hypertrophy Mouth/Parynx benign; Mallinpatti scale 3/4 Neck: No JVD, no carotid bruits; normal carotid upstroke Lungs: clear to ausculatation and percussion; no wheezing or rales Chest wall: without tenderness to palpitation Heart: PMI not displaced, RRR, s1 s2 normal, 1/6 systolic murmur, no diastolic murmur, no rubs, gallops, thrills, or heaves Abdomen: soft, nontender; no hepatosplenomehaly, BS+; abdominal aorta nontender and not dilated by palpation. Back: no CVA tenderness Pulses 2+ Musculoskeletal: full range of motion, normal strength, no joint deformities Extremities: no clubbing cyanosis or edema, Homan's sign negative  Neurologic: grossly nonfocal; Cranial nerves grossly wnl Psychologic: Normal mood and affect   Studies/Labs Reviewed:   July 09, 2022 ECG (independently read by me): NSR at 67, no ectopy, normal intervals  Recent Labs:    Latest Ref Rng & Units 12/17/2021    1:31 PM  BMP  Glucose 70 - 99 mg/dL 84   BUN 6 - 24 mg/dL 7   Creatinine 12/19/2021 - 4.40 mg/dL 3.47   BUN/Creat Ratio 9 - 23 8   Sodium 134 - 144 mmol/L 142   Potassium 3.5 - 5.2 mmol/L 3.7   Chloride 96 - 106 mmol/L 101   CO2 20 - 29 mmol/L 24   Calcium 8.7 - 10.2 mg/dL 9.6          No data to display              No data to display         No results found for: "MCV" No results found for: "TSH" No results found for: "HGBA1C"   BNP No results found for: "BNP"  ProBNP No results found for: "PROBNP"   Lipid Panel  No results found for: "CHOL", "TRIG", "HDL", "CHOLHDL", "VLDL", "LDLCALC", "LDLDIRECT", "LABVLDL"   RADIOLOGY: No results found.   Additional studies/ records that were reviewed today include:     Patient Name: Sydney Fletcher, Sydney Fletcher Date: 03/11/2022 Gender: Female D.O.B: June 05, 1981 Age (years): 40 Referring Provider:  06/18/1981 Height (inches): 64 Interpreting Physician: Georgeanna Lea MD, ABSM Weight (lbs): 228 RPSGT:  [place], [place] BMI: 39 MRN: [id number] Neck Size: 14.00   CLINICAL INFORMATION Sleep Study Type: HST   Indication for sleep study: Snoring, excessive daytime sleepiness   Epworth Sleepiness Score: 17   SLEEP STUDY TECHNIQUE A multi-channel overnight portable sleep study was performed. The channels recorded were: nasal airflow, thoracic respiratory movement, and oxygen saturation with a pulse oximetry. Snoring was also monitored.   MEDICATIONS ALPRAZolam (XANAX) 0.25 MG tablet amLODipine (NORVASC) 5 MG tablet aspirin EC 81 MG tablet buPROPion (WELLBUTRIN XL) 150 MG 24 hr tablet cloNIDine (CATAPRES) 0.1 MG tablet DULoxetine (CYMBALTA) 60 MG capsule Galcanezumab-gnlm (EMGALITY) 120 MG/ML SOAJ Lemborexant (DAYVIGO) 5 MG TABS losartan (COZAAR) 50 MG tablet metoprolol succinate (TOPROL-XL) 100 MG 24 hr tablet Ubrogepant (UBRELVY) 100 MG TABS Vitamin D, Ergocalciferol, (DRISDOL) 1.25 MG (50000 UNIT) CAPS capsule Patient self administered medications include: N/A.   SLEEP ARCHITECTURE Patient was studied for 381.1 minutes. The sleep efficiency was 100.0 % and the patient was supine for 47.1%. The arousal index was 0.0 per hour.   RESPIRATORY PARAMETERS The overall AHI was 6.1 per hour, with a central apnea index of 0 per hour.   The oxygen nadir was 91% during sleep.   CARDIAC DATA Mean heart rate during sleep was 69.3 bpm. Heart rate range was 56 - 92 bpm.   IMPRESSIONS - Mild obstructive sleep apnea occurred during this study (AHI 6.1/h); events wore worse with supine sleep (AHI 10.4/h) versus non-supine sleep (AHI 2.6/h). The severity during REM sleep cannot be assessed on this study. - The patient did not have significant oxygen desaturation with O2 nadir 91%. - Patient snored for 103.2 minutes (27.1%)  during the sleep.   DIAGNOSIS - Obstructive Sleep Apnea  (G47.33)   RECOMMENDATIONS - Therapeutic CPAP titration to determine optimal pressure required to alleviate sleep disordered breathing. Consider a trial of Auto-PAP with EPR of 3 at 6 - 16 cm of water.  - Effort should be made to optimize nasal and oropharyngeal patency. - Positional therapy avoiding supine position during sleep. - If patient is against CPAP initiation a customized oral appliance may be considered. - If patient continues to have excessive daytime sleepiness on therapy consider a further evaluation to assess for narcolepsy or idiopathic hypersomnia. - Avoid alcohol, sedatives and other CNS depressants that may worsen sleep apnea and disrupt normal sleep architecture. - Sleep hygiene should be reviewed to assess factors that may improve sleep quality. - Weight management (BMI 39) and regular exercise should be initiated or continued. - Recommend a download and sleep clinic evaluation one month after initiation of therapy    ASSESSMENT:    1. OSA on CPAP   2. Primary hypertension   3. Morbid obesity (HCC)   4. Snoring   5. Excessive daytime sleepiness   6. Atypical chest pain   7. SVT (supraventricular tachycardia) [facility](HCC)      PLAN:  Sydney Fletcher is a very pleasant 41 year old female who has a history of hypertension, anxiety/depression, and in the past was noted to have atypical chest pain.  Coronary CTA evaluation showed a calcium score of 0.  An echo Doppler study has shown an EF of 60 to 65% with mild concentric LVH with normal diastolic parameters.  She was found to have some bursts of SVT on Zio patch monitoring.  She had symptoms highly suggestive of sleep apnea with snoring, nonrestorative sleep, frequent nocturia, and daytime sleepiness.  I reviewed her home sleep study with her in detail today which showed  overall mild sleep apnea with events more prominent with supine position.  At the time she had an Epworth Sleepiness Scale score of 17.  She snored for 103.2%  of her sleep study.  She has been recently started on CPAP therapy with initial set up date on April 22, 2022.  At that time initially she was sleeping in a recliner.  Her most recent download confirms that she is meeting compliance standards with usage days at 97% and usage greater than 4 hours at 83%.  Her most recent download demonstrates excellent benefit with an AHI of 1.4.  Her current pressure is set at a range of 6 to 16 cm of water and her 95th percentile pressure is 12.0 with a maximum average pressure at 13.1.  I will change her initial settings and will increase her ramp start pressure to 6 and change her auto settings to a range of 8-16 rather than initiating at 6.  I had a long discussion with her today in the office.  I reviewed normal sleep architecture and potential disruption of normal sleep architecture with untreated sleep apnea.  I discussed implications with reference to blood pressure control particularly with increasing arousals inducing increased sympathetic tone during non-REM parasympathetic sleep.  I discussed its potential effects on potentiation of nocturnal arrhythmias as well as increased risk for development of atrial fibrillation.  In addition I discussed its implications and effects relating to insulin resistance, increased inflammatory markers, and GERD.  I also discussed potential apnea mediated hypoxemia contributing to potential nocturnal ischemia if atherosclerosis is present.  I discussed optimal sleep duration at 7 and 9 hours in an adult.  Her BMI is 41.9 and is consistent with morbid obesity.  I discussed the importance of weight loss and increased exercise.  In addition I discussed the pathophysiology associated with increased nocturia and untreated sleep apnea and this has significantly improved with initiation of therapy.  She is meeting compliance standards.  I answered all her questions.  I will see her in 1 year for follow-up evaluation or as needed in the  future.   Medication Adjustments/Labs and Tests Ordered: Current medicines are reviewed at length with the patient today.  Concerns regarding medicines are outlined above.  Medication changes, Labs and Tests ordered today are listed in the Patient Instructions below. Patient Instructions  Medication Instructions:  Your physician recommends that you continue on your current medications as directed. Please refer to the Current Medication list given to you today.  *If you need a refill on your cardiac medications before your next appointment, please call your pharmacy*   Follow-Up: At [facility], you and your health needs are our priority.  As part of our continuing mission to provide you with exceptional heart care, we have created designated Provider Care Teams.  These Care Teams include your primary Cardiologist (physician) and Advanced Practice Providers (APPs -  Physician Assistants and Nurse Practitioners) who all work together to provide you with the care you need, when you need it.  We recommend signing up for the patient portal called "MyChart".  Sign up information is provided on this After Visit Summary.  MyChart is used to connect with patients for Virtual Visits (Telemedicine).  Patients are able to view lab/test results, encounter notes, upcoming appointments, etc.  Non-urgent messages can be sent to your provider as well.   To learn more about what you can do with MyChart, go to [email or web address].    Your next appointment:   12 month(s)  The format for your next appointment:   In Person  Provider:   Nicki Guadalajara, MD   Other Instructions Sleep Clinic   Signed, Nicki Guadalajara, MD, [facility], ABSM Diplomate, American Board of Sleep Medicine  07/12/2022 4:29 PM    [facility] [address], [address], [place], [place]  [address] Phone: [phone]

## 2022-07-09 NOTE — Patient Instructions (Signed)

## 2022-07-12 ENCOUNTER — Encounter: Payer: Self-pay | Admitting: Cardiovascular Disease

## 2022-07-29 DIAGNOSIS — G971 Other reaction to spinal and lumbar puncture: Secondary | ICD-10-CM

## 2022-07-29 HISTORY — DX: Other reaction to spinal and lumbar puncture: G97.1

## 2022-09-23 ENCOUNTER — Encounter: Payer: Self-pay | Admitting: Cardiology

## 2022-09-23 ENCOUNTER — Ambulatory Visit: Payer: BC Managed Care – PPO | Attending: Cardiology | Admitting: Cardiology

## 2022-09-23 VITALS — BP 124/86 | HR 66 | Ht 64.0 in | Wt 246.4 lb

## 2022-09-23 DIAGNOSIS — Z794 Long term (current) use of insulin: Secondary | ICD-10-CM

## 2022-09-23 DIAGNOSIS — E119 Type 2 diabetes mellitus without complications: Secondary | ICD-10-CM

## 2022-09-23 DIAGNOSIS — G379 Demyelinating disease of central nervous system, unspecified: Secondary | ICD-10-CM | POA: Diagnosis not present

## 2022-09-23 DIAGNOSIS — R0789 Other chest pain: Secondary | ICD-10-CM

## 2022-09-23 DIAGNOSIS — E782 Mixed hyperlipidemia: Secondary | ICD-10-CM

## 2022-09-23 DIAGNOSIS — I10 Essential (primary) hypertension: Secondary | ICD-10-CM

## 2022-09-23 NOTE — Progress Notes (Signed)
Cardiology Office Note:    Date:  09/23/2022   ID:  Sydney Fletcher, DOB [birth year], MRN [id number]  PCP:  Joaquin Music, NP  Cardiologist:  Gypsy Balsam, MD    Referring MD: Joaquin Music, NP   Chief Complaint  Patient presents with   Follow-up    History of Present Illness:    Sydney Fletcher is a 41 y.o. female with past medical history significant for essential hypertension, depression, anxiety initially was referred to [place] because of a constellation of atypical symptoms.  After that quite extensive evaluation has been performed which included coronary CT angio showing normal coronaries without significant obstruction, she also wore a Zio patch which identified some supraventricular tachycardia, metoprolol has been given more overall and nothing critical.  She was also identified to have significant sleep apnea, she is using CPAP mask and feels significantly better.  Also she developed some neurological symptoms being seen by neurology there is some suspicion for multiple sclerosis.  Past Medical History:  Diagnosis Date   Anxiety 03/19/2017   Arthritis 01/29/2018   Formatting of this note might be different from the original. 2019: hands   Atypical chest pain 12/11/2021   Atypical squamous cells of undetermined significance on cytologic smear of cervix (ASC-US) 03/19/2017   Formatting of this note might be different from the original. HRHPV (+) Formatting of this note might be different from the original. HRHPV (+)   Benign hypertension 03/19/2017   Formatting of this note might be different from the original. 2018: stage 3, atenolol Formatting of this note might be different from the original. 2018: stage 3, atenolol   Dietary counseling and surveillance 12/11/2021   Encounter for orthopedic follow-up care 06/22/2020   Fibromyalgia 12/15/2018   Generalized anxiety disorder 12/11/2021   Insomnia 12/11/2021   Low serum progesterone 04/16/2018   Formatting of this note might be  different from the original. 2019: post-mirena Formatting of this note might be different from the original. 2019: post-mirena   Lumbar pain 08/19/2019   Mixed hyperlipidemia 06/30/2018   Formatting of this note might be different from the original. 2020   Moderate major depression, single episode (HCC) 12/11/2021   Other vitamin B12 deficiency anemias 12/11/2021   Recurrent major depressive disorder, in remission (HCC) 07/08/2017   Formatting of this note might be different from the original. 2018: 14 Formatting of this note might be different from the original. 2018: 14   Seizure-like activity (HCC) 05/19/2022   Spinal puncture headache 07/29/2022   Vitamin D deficiency 12/11/2021    Past Surgical History:  Procedure Laterality Date   CHOLECYSTECTOMY  10/2017   HAND SURGERY Right 07/14/2020   TUBAL LIGATION  02/2018    Current Medications: Current Meds  Medication Sig   ALPRAZolam (XANAX) 0.25 MG tablet Take 0.25 mg by mouth daily as needed for anxiety. For panic attacks   aspirin EC 81 MG tablet Take 1 tablet (81 mg total) by mouth daily. Swallow whole.   buPROPion (WELLBUTRIN XL) 150 MG 24 hr tablet Take 150 mg by mouth daily.   cloNIDine (CATAPRES) 0.1 MG tablet Take 0.1 mg by mouth daily as needed (If dystolic over 90). For BP over 160/100   DULoxetine (CYMBALTA) 60 MG capsule Take 60 mg by mouth daily.   losartan (COZAAR) 50 MG tablet Take 50 mg by mouth daily.   metoprolol succinate (TOPROL-XL) 100 MG 24 hr tablet Take 1 tablet (100 mg total) by mouth daily. Take with or immediately following a meal.  Ubrogepant (UBRELVY) 100 MG TABS Take 100 mg by mouth as needed (migraine). May take second dose at least 2 hours after first dose as needed once daily   Vitamin D, Ergocalciferol, (DRISDOL) 1.25 MG (50000 UNIT) CAPS capsule Take 50,000 Units by mouth every 7 (seven) days.   [DISCONTINUED] amLODipine (NORVASC) 5 MG tablet Take 5 mg by mouth daily.     Allergies:   Codeine    Social History   Socioeconomic History   Marital status: Married    Spouse name: Not on file   Number of children: Not on file   Years of education: Not on file   Highest education level: Not on file  Occupational History   Not on file  Tobacco Use   Smoking status: Former    Types: Cigarettes    Quit date: 12/19/2020    Years since quitting: 1.7   Smokeless tobacco: Never  Substance and Sexual Activity   Alcohol use: Not Currently   Drug use: Never   Sexual activity: Yes  Other Topics Concern   Not on file  Social History Narrative   Not on file   Social Determinants of Health   Financial Resource Strain: Not on file  Food Insecurity: Not on file  Transportation Needs: Not on file  Physical Activity: Not on file  Stress: Not on file  Social Connections: Not on file     Family History: The patient's family history includes Diabetes in her paternal grandmother; Heart attack in her father; Hypertension in her father; Migraines in her father; Rheum arthritis in her maternal grandmother. ROS:   Please see the history of present illness.    All 14 point review of systems negative except as described per history of present illness  EKGs/Labs/Other Studies Reviewed:      Recent Labs: 12/17/2021: BUN 7; Creatinine, Ser 0.90; Potassium 3.7; Sodium 142  Recent Lipid Panel No results found for: "CHOL", "TRIG", "HDL", "CHOLHDL", "VLDL", "LDLCALC", "LDLDIRECT"  Physical Exam:    VS:  BP 124/86 (BP Location: Left Arm, Patient Position: Sitting)   Pulse 66   Ht 5\' 4"  (1.626 m)   Wt 246 lb 6.4 oz (111.8 kg)   SpO2 99%   BMI 42.29 kg/m     Wt Readings from Last 3 Encounters:  09/23/22 246 lb 6.4 oz (111.8 kg)  07/09/22 244 lb (110.7 kg)  03/11/22 228 lb (103.4 kg)     GEN:  Well nourished, well developed in no acute distress HEENT: Normal NECK: No JVD; No carotid bruits LYMPHATICS: No lymphadenopathy CARDIAC: RRR, no murmurs, no rubs, no gallops RESPIRATORY:   Clear to auscultation without rales, wheezing or rhonchi  ABDOMEN: Soft, non-tender, non-distended MUSCULOSKELETAL:  No edema; No deformity  SKIN: Warm and dry LOWER EXTREMITIES: no swelling NEUROLOGIC:  Alert and oriented x 3 PSYCHIATRIC:  Normal affect   ASSESSMENT:    1. Benign hypertension   2. Type 2 diabetes mellitus without complication, with long-term current use of insulin (HCC)   3. Demyelinating disease of central nervous system (HCC)   4. Atypical chest pain   5. Mixed hyperlipidemia    PLAN:    In order of problems listed above:  Essential hypertension finally blood pressure seems to well controlled.  I think treatment of sleep apnea seems to help significantly Type 2 diabetes followed by internal medicine team, I do not have her hemoglobin A1c available. Suspicion for multiple sclerosis I did review notes by neurology.  Still no conclusion work-up is  still pending. Mixed dyslipidemia we will make arrangements for fasting lipid profile to be done Atypical chest pain denies having any   Medication Adjustments/Labs and Tests Ordered: Current medicines are reviewed at length with the patient today.  Concerns regarding medicines are outlined above.  No orders of the defined types were placed in this encounter.  Medication changes: No orders of the defined types were placed in this encounter.   Signed, Park Liter, MD, [facility] 09/23/2022 4:42 PM    [place]

## 2022-09-23 NOTE — Patient Instructions (Signed)

## 2023-03-04 ENCOUNTER — Ambulatory Visit: Payer: BC Managed Care – PPO | Admitting: Diagnostic Neuroimaging

## 2023-03-04 ENCOUNTER — Encounter: Payer: Self-pay | Admitting: Diagnostic Neuroimaging

## 2023-03-04 VITALS — BP 124/84 | HR 70 | Ht 64.0 in | Wt 249.2 lb

## 2023-03-04 DIAGNOSIS — G43009 Migraine without aura, not intractable, without status migrainosus: Secondary | ICD-10-CM

## 2023-03-04 DIAGNOSIS — R9089 Other abnormal findings on diagnostic imaging of central nervous system: Secondary | ICD-10-CM

## 2023-03-04 DIAGNOSIS — R259 Unspecified abnormal involuntary movements: Secondary | ICD-10-CM

## 2023-03-04 DIAGNOSIS — Q859 Phakomatosis, unspecified: Secondary | ICD-10-CM

## 2023-03-04 DIAGNOSIS — G253 Myoclonus: Secondary | ICD-10-CM | POA: Diagnosis not present

## 2023-03-04 NOTE — Patient Instructions (Signed)
  ABNORMAL MOVEMENTS (myoclonus) - check EEG - monitor symptosm --> consider anti-seizure medications (clonazepam, lacosamide, levetiracetam)  ABNORMAL MRI BRAIN  - long standing abnl since age 42 years old; appear to be old hamartomatous lesions; question tuberous sclerosis; consider genetic testing - multiple sclerosis is ruled out (LP normal; MRI findings chronic since age 68 years old)  MIGRAINE WITHOUT AURA - emgality + Bernita Raisin

## 2023-03-04 NOTE — Progress Notes (Addendum)
[facility]  PATIENT: Sydney Fletcher DOB: [birth year]  REFERRING CLINICIAN: Joaquin Music, NP HISTORY FROM: patient  REASON FOR VISIT: new consult   HISTORICAL  CHIEF COMPLAINT:  Chief Complaint  Patient presents with   New Patient (Initial Visit)    Patient in room #7 and alone. Patient states she has tremor and needs understanding of why are these tremor happen.    HISTORY OF PRESENT ILLNESS:   42 year old female here for evaluation of intermittent tremors.  January 2023 patient had onset of intermittent tremor muscle jerks sensations, sometimes in the arms, sometimes in the legs, sometimes unilateral or bilateral.  Symptoms could last for few seconds at a time, fluctuate over the course of minutes or hours.  Initially these were occurring mainly when she would lay down to go to sleep.  Over time the send progressed and can occur during the daytime.  Patient went to neurologist for evaluation and had MRI of the brain which showed some abnormalities raising possibility of MS.  She had spinal tap testing which was unremarkable.  Of note patient has had headaches and abnormal MRI study dating back to age 74 years old.  She was diagnosed with multiple hematomas of the brain, not otherwise specified.  Some of these enhanced faintly in the past.  She was also treated for migraines by Dr. Meryl Crutch and then Dr Sharene Skeans.  Patient shared a video of these episodes which look like intermittent myoclonic jerks of the head, body and arms.  Symptoms occur about 5 days out of the week.   REVIEW OF SYSTEMS: Full 14 system review of systems performed and negative with exception of: as per HPI.  ALLERGIES: Allergies  Allergen Reactions   Codeine Hives    HOME MEDICATIONS: Outpatient Medications Prior to Visit  Medication Sig Dispense Refill   ALPRAZolam (XANAX) 0.25 MG tablet Take 0.25 mg by mouth daily as needed for anxiety. For panic attacks     aspirin EC 81 MG tablet  Take 1 tablet (81 mg total) by mouth daily. Swallow whole. 90 tablet 3   baclofen (LIORESAL) 10 MG tablet Take 10 mg by mouth 2 (two) times daily.     buPROPion (WELLBUTRIN XL) 150 MG 24 hr tablet Take 150 mg by mouth daily.     cloNIDine (CATAPRES) 0.1 MG tablet Take 0.1 mg by mouth daily as needed (If dystolic over 90). For BP over 160/100     DULoxetine (CYMBALTA) 60 MG capsule Take 60 mg by mouth daily.     EMGALITY 120 MG/ML SOSY Inject 120 mLs into the skin every 30 (thirty) days.     Lemborexant (DAYVIGO) 5 MG TABS Take 5 mg by mouth once a week.     losartan (COZAAR) 50 MG tablet Take 50 mg by mouth daily.     metoprolol succinate (TOPROL-XL) 100 MG 24 hr tablet Take 1 tablet (100 mg total) by mouth daily. Take with or immediately following a meal. 90 tablet 3   tiZANidine (ZANAFLEX) 2 MG tablet Take 2 mg by mouth 3 (three) times daily.     Ubrogepant (UBRELVY) 100 MG TABS Take 100 mg by mouth as needed (migraine). May take second dose at least 2 hours after first dose as needed once daily     Vitamin D, Ergocalciferol, (DRISDOL) 1.25 MG (50000 UNIT) CAPS capsule Take 50,000 Units by mouth every 7 (seven) days.     No facility-administered medications prior to visit.    PAST MEDICAL HISTORY: Past Medical  History:  Diagnosis Date   Anxiety 03/19/2017   Arthritis 01/29/2018   Formatting of this note might be different from the original. 2019: hands   Atypical chest pain 12/11/2021   Atypical squamous cells of undetermined significance on cytologic smear of cervix (ASC-US) 03/19/2017   Formatting of this note might be different from the original. HRHPV (+) Formatting of this note might be different from the original. HRHPV (+)   Benign hypertension 03/19/2017   Formatting of this note might be different from the original. 2018: stage 3, atenolol Formatting of this note might be different from the original. 2018: stage 3, atenolol   Dietary counseling and surveillance 12/11/2021    Encounter for orthopedic follow-up care 06/22/2020   Fibromyalgia 12/15/2018   Generalized anxiety disorder 12/11/2021   Insomnia 12/11/2021   Low serum progesterone 04/16/2018   Formatting of this note might be different from the original. 2019: post-mirena Formatting of this note might be different from the original. 2019: post-mirena   Lumbar pain 08/19/2019   Mixed hyperlipidemia 06/30/2018   Formatting of this note might be different from the original. 2020   Moderate major depression, single episode (HCC) 12/11/2021   Other vitamin B12 deficiency anemias 12/11/2021   Recurrent major depressive disorder, in remission (HCC) 07/08/2017   Formatting of this note might be different from the original. 2018: 14 Formatting of this note might be different from the original. 2018: 14   Seizure-like activity (HCC) 05/19/2022   Spinal puncture headache 07/29/2022   Vitamin D deficiency 12/11/2021    PAST SURGICAL HISTORY: Past Surgical History:  Procedure Laterality Date   CHOLECYSTECTOMY  10/2017   HAND SURGERY Right 07/14/2020   TUBAL LIGATION  02/2018    FAMILY HISTORY: Family History  Problem Relation Age of Onset   Heart attack Father    Hypertension Father    Migraines Father    Rheum arthritis Maternal Grandmother    Diabetes Paternal Grandmother     SOCIAL HISTORY: Social History   Socioeconomic History   Marital status: Married    Spouse name: Not on file   Number of children: Not on file   Years of education: Not on file   Highest education level: Not on file  Occupational History   Not on file  Tobacco Use   Smoking status: Former    Types: Cigarettes    Quit date: 12/19/2020    Years since quitting: 2.2   Smokeless tobacco: Never  Substance and Sexual Activity   Alcohol use: Not Currently   Drug use: Never   Sexual activity: Yes  Other Topics Concern   Not on file  Social History Narrative   Not on file   Social Determinants of Health   Financial  Resource Strain: Not on file  Food Insecurity: Not on file  Transportation Needs: Not on file  Physical Activity: Not on file  Stress: Not on file  Social Connections: Not on file  Intimate Partner Violence: Not on file     PHYSICAL EXAM  GENERAL EXAM/CONSTITUTIONAL: Vitals:  Vitals:   03/04/23 0941  BP: 124/84  Pulse: 70  Weight: 249 lb 3.2 oz (113 kg)  Height:  (1.626 m)   Body mass index is 42.78 kg/m. Wt Readings from Last 3 Encounters:  03/04/23 249 lb 3.2 oz (113 kg)  09/23/22 246 lb 6.4 oz (111.8 kg)  07/09/22 244 lb (110.7 kg)   Patient is in no distress; well developed, nourished and groomed; neck is supple  CARDIOVASCULAR: Examination of carotid arteries is normal; no carotid bruits Regular rate and rhythm, no murmurs Examination of peripheral vascular system by observation and palpation is normal  EYES: Ophthalmoscopic exam of optic discs and posterior segments is normal; no papilledema or hemorrhages No results found.  MUSCULOSKELETAL: Gait, strength, tone, movements noted in Neurologic exam below  NEUROLOGIC: MENTAL STATUS:      No data to display         awake, alert, oriented to person, place and time recent and remote memory intact normal attention and concentration language fluent, comprehension intact, naming intact fund of knowledge appropriate  CRANIAL NERVE:  2nd - no papilledema on fundoscopic exam 2nd, 3rd, 4th, 6th - pupils equal and reactive to light, visual fields full to confrontation, extraocular muscles intact, no nystagmus 5th - facial sensation symmetric 7th - facial strength symmetric 8th - hearing intact 9th - palate elevates symmetrically, uvula midline 11th - shoulder shrug symmetric 12th - tongue protrusion midline  MOTOR:  normal bulk and tone, full strength in the BUE, BLE  SENSORY:  normal and symmetric to light touch, temperature, vibration  COORDINATION:  finger-nose-finger, fine finger movements  normal  REFLEXES:  deep tendon reflexes TRACE and symmetric  GAIT/STATION:  narrow based gait     DIAGNOSTIC DATA (LABS, IMAGING, TESTING) - I reviewed patient records, labs, notes, testing and imaging myself where available.  No results found for: "WBC", "HGB", "HCT", "MCV", "PLT"    Component Value Date/Time   NA 142 12/17/2021 1331   K 3.7 12/17/2021 1331   CL 101 12/17/2021 1331   CO2 24 12/17/2021 1331   GLUCOSE 84 12/17/2021 1331   BUN 7 12/17/2021 1331   CREATININE 0.90 12/17/2021 1331   CALCIUM 9.6 12/17/2021 1331   No results found for: "CHOL", "HDL", "LDLCALC", "LDLDIRECT", "TRIG", "CHOLHDL" No results found for: "HGBA1C" No results found for: "VITAMINB12" No results found for: "TSH"  08/08/22 MRI brain - multiple T2 hyperintensities; FAINT enhancement of 1 lesion  06/24/93 MRI brain - multiple hamartomas noted (? Tuberous sclerosis)  05/29/22 MRI brain (without) [I reviewed images myself and agree with interpretation. -VRP]  Multiple T2 hyperintense white matter lesions concerning for  possible demyelinating disease. The possibility of metastatic  disease cannot be excluded but appears less likely. Recommend  postcontrast images.   06/18/22 MRI brain with - Motion degraded examination. Subtle enhancement of a previously  shown T2 hyperintense lesion in the left temporal lobe without  definite enhancement of other lesions. This may reflect  demyelinating disease or an inflammatory process. Metastatic disease  is unlikely.   06/18/22 MRI cervical  - No definite abnormal cord signal within limitation of motion  degradation. No abnormal enhancement.   06/18/22 MRI thoracic - No abnormal cord signal or enhancement.   07/22/22 CSF Component Ref Range & Units 7 mo ago  Color, CSF Colorless Colorless  Appearance, CSF Clear Clear  TOTAL NUCLEATED CELLS, CSF 0 - 5 /MM3 2  RBC, CSF 0 /MM3 3 High    Component Ref Range & Units 7 mo ago  Glucose, CSF 40 - 70  MG/DL 59   Component Ref Range & Units 7 mo ago  Protein, CSF 15 - 45 MG/DL 23   Zero (0) oligoclonal bands were observed in the CSF.   Component Ref Range & Units 7 mo ago Comments  HX MYELIN BASIC PROTEIN (SO) 0.0 - 3.7 ng/mL 0.9      ASSESSMENT AND PLAN  42 y.o. year old  female here with:   Dx:  1. Abnormal movement   2. Myoclonus   3. Abnormal finding on MRI of brain   4. Hamartoma of brain   5. Migraine without aura and without status migrainosus, not intractable       PLAN:  ABNORMAL MOVEMENTS (myoclonus; since 2023) - check EEG - monitor symptoms --> consider anti-seizure medications (clonazepam, lacosamide, levetiracetam)  ABNORMAL MRI BRAIN  - long standing abnl since age 13 years old; appear to be old hamartomatous lesions; question tuberous sclerosis; consider genetic testing - multiple sclerosis is ruled out (LP normal; MRI findings chronic since age 74 years old)  MIGRAINE WITHOUT AURA - emgality + ubrelvy  Orders Placed This Encounter  Procedures   EEG adult   Return in about 6 months (around 09/03/2023).  I spent 60 minutes of face-to-face and non-face-to-face time with patient.  This included previsit chart review, lab review, study review, order entry, electronic health record documentation, patient education.     Suanne Marker, MD 03/04/2023, 11:05 AM Certified in Neurology, Neurophysiology and Neuroimaging  [facility] [address] [place], [place] [address] [phone]

## 2023-03-16 ENCOUNTER — Other Ambulatory Visit: Payer: BC Managed Care – PPO | Admitting: *Deleted

## 2023-03-23 ENCOUNTER — Ambulatory Visit: Payer: BC Managed Care – PPO | Admitting: Diagnostic Neuroimaging

## 2023-03-23 DIAGNOSIS — G253 Myoclonus: Secondary | ICD-10-CM

## 2023-03-23 DIAGNOSIS — R259 Unspecified abnormal involuntary movements: Secondary | ICD-10-CM | POA: Diagnosis not present

## 2023-04-06 ENCOUNTER — Telehealth: Payer: Self-pay | Admitting: Diagnostic Neuroimaging

## 2023-04-06 NOTE — Procedures (Signed)
   [facility]  EEG (ELECTROENCEPHALOGRAM) REPORT   STUDY DATE: 03/23/23 PATIENT NAME: Sydney Fletcher DOB: [birth year] MRN: [id number]  ORDERING CLINICIAN: Joycelyn Schmid, MD   TECHNOLOGIST: Marcheta Grammes TECHNIQUE: Electroencephalogram was recorded utilizing standard 10-20 system of lead placement and reformatted into average and bipolar montages.  RECORDING TIME: 26 minutes ACTIVATION: hyperventilation and photic stimulation  CLINICAL INFORMATION: 42 year old female with abnormal movements.  FINDINGS: Posterior dominant background rhythms, which attenuate with eye opening, ranging 8-9 hertz and 30-40 microvolts. No focal, lateralizing, epileptiform activity or seizures are seen. Patient recorded in the awake and drowsy state. EKG channel shows regular rhythm of 60-65 beats per minute.   IMPRESSION:   Normal EEG in the awake and drowsy states.   INTERPRETING PHYSICIAN:  Suanne Marker, MD Certified in Neurology, Neurophysiology and Neuroimaging  [facility] [address] [place], [place] [address] [phone]

## 2023-04-06 NOTE — Telephone Encounter (Signed)
Sent mychart msg informing pt of appt change due to provider schedule change 

## 2023-09-01 ENCOUNTER — Ambulatory Visit: Payer: BC Managed Care – PPO | Admitting: Diagnostic Neuroimaging

## 2023-09-03 ENCOUNTER — Ambulatory Visit: Payer: BC Managed Care – PPO | Admitting: Diagnostic Neuroimaging

## 2023-11-08 IMAGING — CT CT HEART MORP W/ CTA COR W/ SCORE W/ CA W/CM &/OR W/O CM
4 of 7 series · 8 of 20 positions shown, 9 images · IV contrast (APPLIED)
Comparison: None.
COMPARISON: None.

Addendum:
EXAM:
OVER-READ INTERPRETATION  CT CHEST

The following report is an over-read performed by radiologist Dr.
Blessy Ricard [[email or web address]] on 12/23/2021. This
over-read does not include interpretation of cardiac or coronary
anatomy or pathology. The coronary calcium score/coronary CTA
interpretation by the cardiologist is attached.
CLINICAL DATA: CP
Cardiac/Coronary  CTA
TECHNIQUE: The patient was scanned on a [employer] scanner.

[Series 7: best diast 76 % · axial · 0.39mm/px · z∈[+1147,+1180]mm · 2 of 246 slices shown]
[im 82/246  vessel]
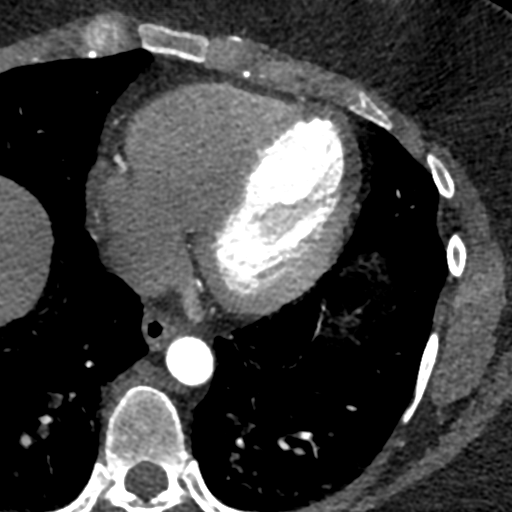
[im 164/246  vessel]
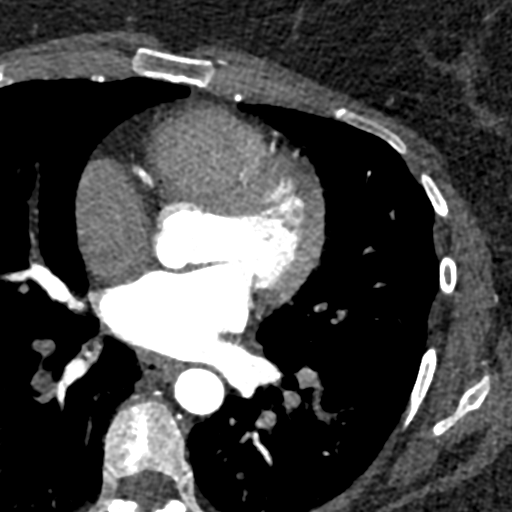

[Series 8: best syst · axial · 0.39mm/px · z∈[+1147,+1180]mm · 2 of 246 slices shown, 3 images]
[im 82/246  vessel]
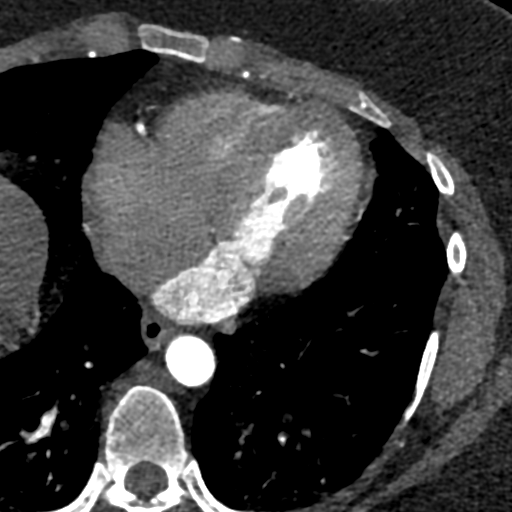
[im 82/246  lung]
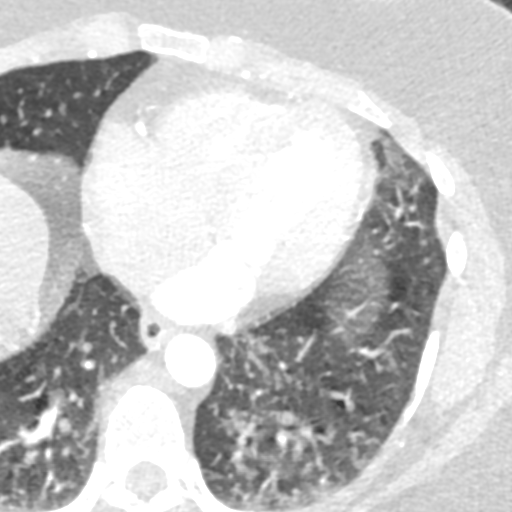
[im 164/246  vessel]
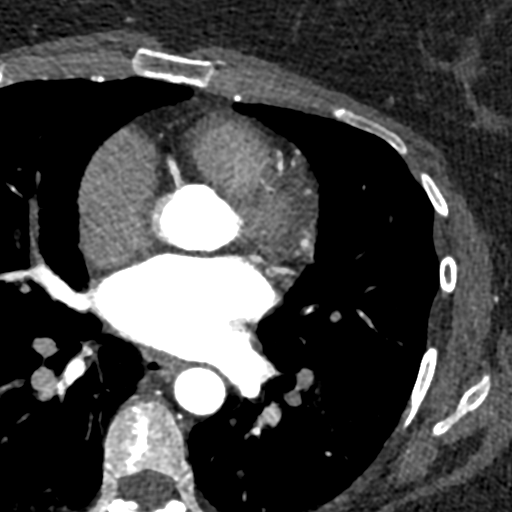

[Series 9: ts diast sharp 76 % · axial · 0.39mm/px · z∈[+1147,+1180]mm · 2 of 246 slices shown]
[im 82/246  lung]
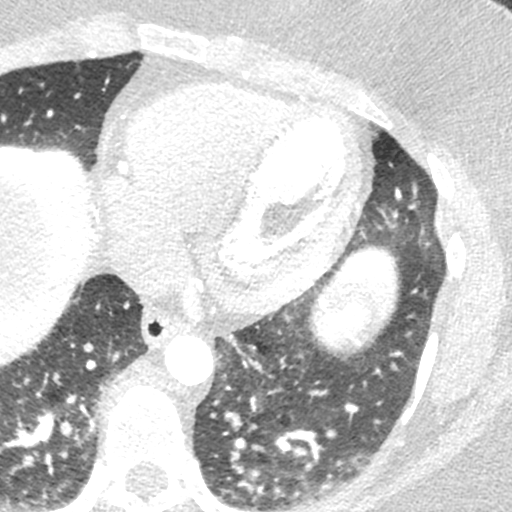
[im 164/246  lung]
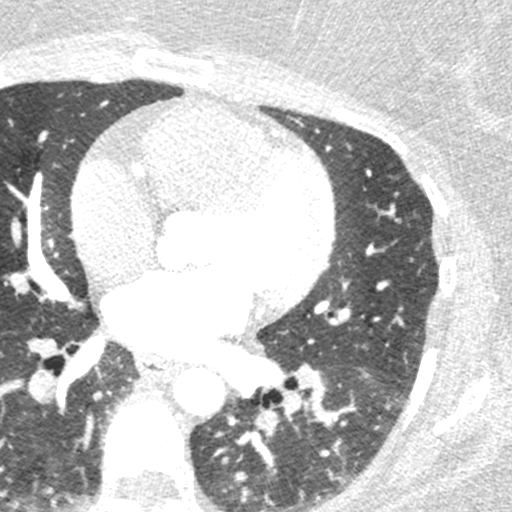

[Series 10: ts syst sharp · axial · 0.39mm/px · z∈[+1147,+1180]mm · 2 of 246 slices shown]
[im 82/246  lung]
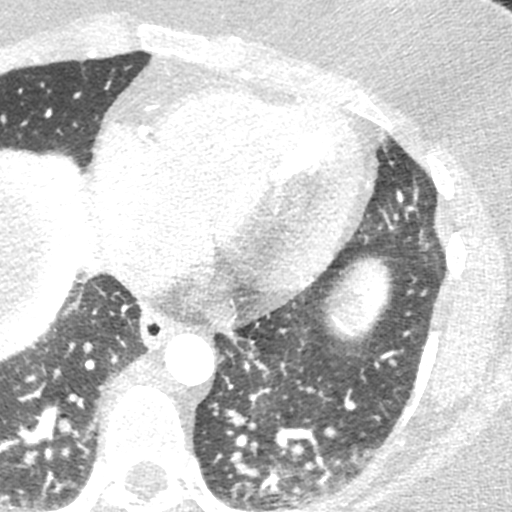
[im 164/246  lung]
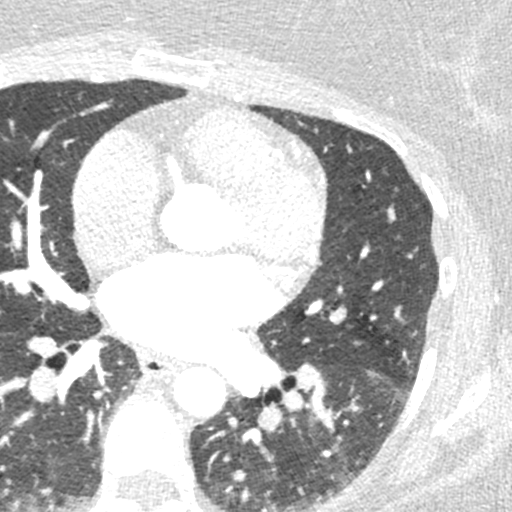

[8 of 20 positions shown; findings below may reference images not displayed]

FINDINGS: Vascular: None.

Mediastinum/Nodes: None.

Lungs/Pleura: 4 mm peripheral right middle lobe nodule ([DATE]). Mild
dependent atelectasis. No pleural fluid.

Upper Abdomen: None.

Musculoskeletal: Mild endplate degenerative changes.
IMPRESSION: 4 mm right middle lobe nodule. No follow-up needed if patient is
low-risk. Non-contrast chest CT can be considered in 12 months if
patient is high-risk. This recommendation follows the consensus
statement: Guidelines for Management of Incidental Pulmonary Nodules
Detected on CT Images: From the [HOSPITAL] 9364; Radiology
FINDINGS: A 100 kV prospective scan was triggered in the descending thoracic
aorta at 111 HU's. Axial non-contrast 3 mm slices were carried out
through the heart. The data set was analyzed on a dedicated work
station and scored using the Agatson method. Gantry rotation speed
was 250 msecs and collimation was .6 mm. No beta blockade and 0.8 mg
of sl NTG was given. The 3D data set was reconstructed in 5%
intervals of the 67-82 % of the R-R cycle. Diastolic phases were
analyzed on a dedicated work station using MPR, MIP and VRT modes.
The patient received 80 cc of contrast.

Aorta:  Normal size.  No calcifications.  No dissection.

Aortic Valve:  Trileaflet.  No calcifications.

Coronary Arteries:  Normal coronary origin.  Right dominance.

RCA is a large dominant artery that gives rise to PDA and PLA. There
is no plaque.

Left main is a large artery that gives rise to LAD and LCX arteries.

LAD is a large vessel that has no plaque. This artery give rise to
moderate size D1, D2, D3. Motion artefacts noted.

LCX is a non-dominant artery that gives rise to one large OM1
branch. There is no plaque.

Other findings:

Normal pulmonary vein drainage into the left atrium.

Normal left atrial appendage without a thrombus.

Normal size of the pulmonary artery.
IMPRESSION: 1. Coronary calcium score of 0. This was 0 percentile for age and
sex matched control.

2. Normal coronary origin with right dominance.

3. CAD-RADS 0. No evidence of CAD (0%). Consider non-atherosclerotic
causes of chest pain.

*** End of Addendum ***
EXAM:
OVER-READ INTERPRETATION  CT CHEST

The following report is an over-read performed by radiologist Dr.
Blessy Ricard [REDACTED] on 12/23/2021. This
over-read does not include interpretation of cardiac or coronary
anatomy or pathology. The coronary calcium score/coronary CTA
interpretation by the cardiologist is attached.
FINDINGS: Vascular: None.

Mediastinum/Nodes: None.

Lungs/Pleura: 4 mm peripheral right middle lobe nodule ([DATE]). Mild
dependent atelectasis. No pleural fluid.

Upper Abdomen: None.

Musculoskeletal: Mild endplate degenerative changes.
IMPRESSION: 4 mm right middle lobe nodule. No follow-up needed if patient is
low-risk. Non-contrast chest CT can be considered in 12 months if
patient is high-risk. This recommendation follows the consensus
statement: Guidelines for Management of Incidental Pulmonary Nodules
Detected on CT Images: From the [HOSPITAL] 9364; Radiology
